# Patient Record
Sex: Male | Born: 1960 | Marital: Single | Smoking: Never smoker
Health system: Southern US, Community
[De-identification: ages and names within clinical notes are randomized; demographics above are authoritative.]

## PROBLEM LIST (undated history)

## (undated) HISTORY — PX: APPENDECTOMY: SHX54

---

## 2004-03-23 ENCOUNTER — Ambulatory Visit: Payer: Self-pay | Admitting: Internal Medicine

## 2004-06-15 ENCOUNTER — Ambulatory Visit: Payer: Self-pay | Admitting: Gastroenterology

## 2005-12-27 IMAGING — US ABDOMEN ULTRASOUND
1 series · 17 of 25 positions shown · non-contrast
Comparison: none

REASON FOR EXAM: Right flank and back pain, unknown etiology
COMMENTS:

[Series 1: abdomen ultrasound · 17 of 87 slices shown]
[im 1/87]
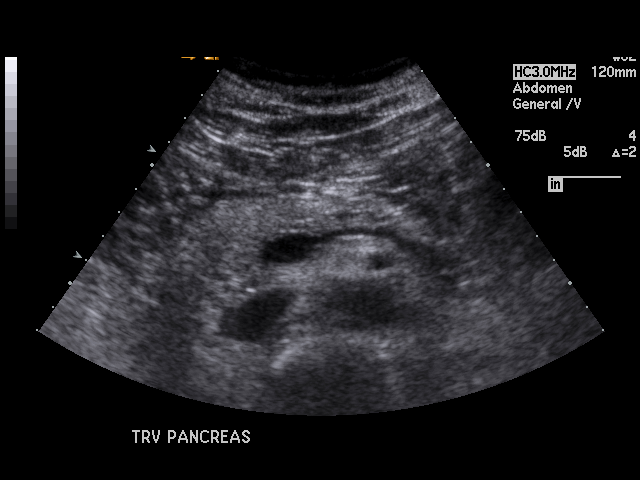
[im 8/87]
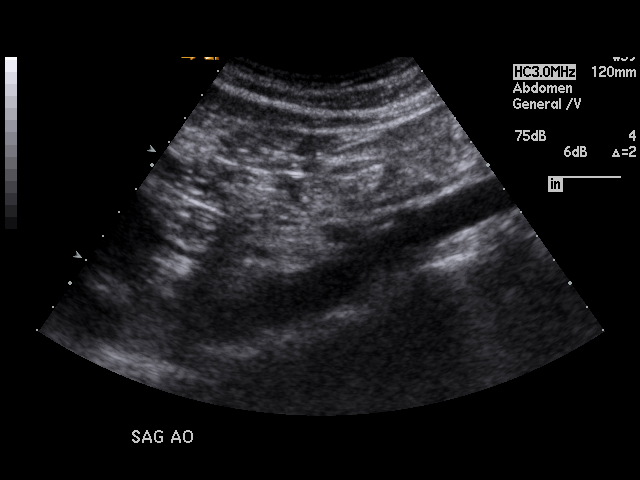
[im 11/87]
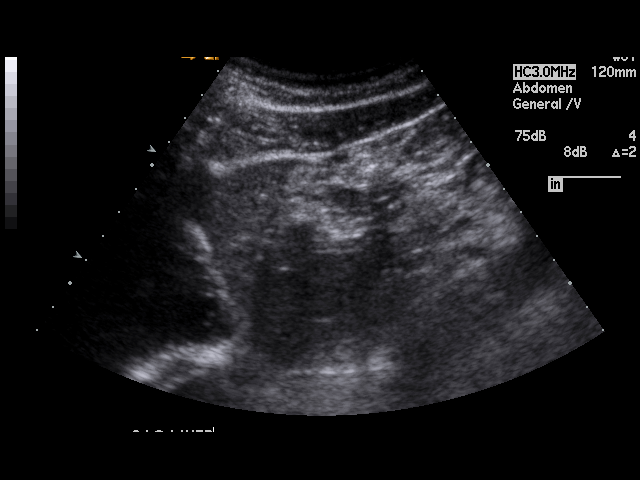
[im 18/87]
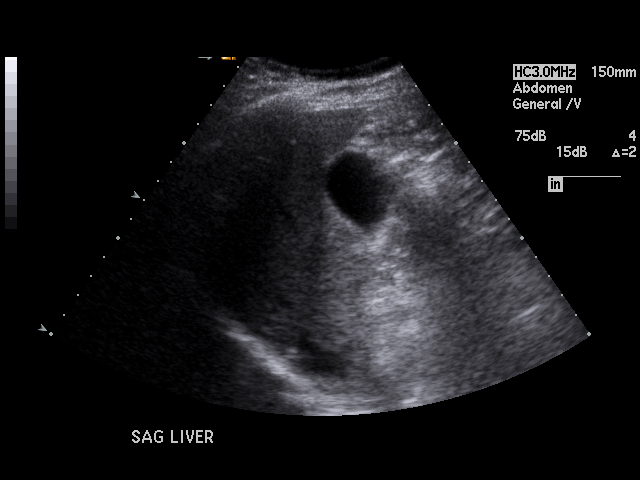
[im 22/87]
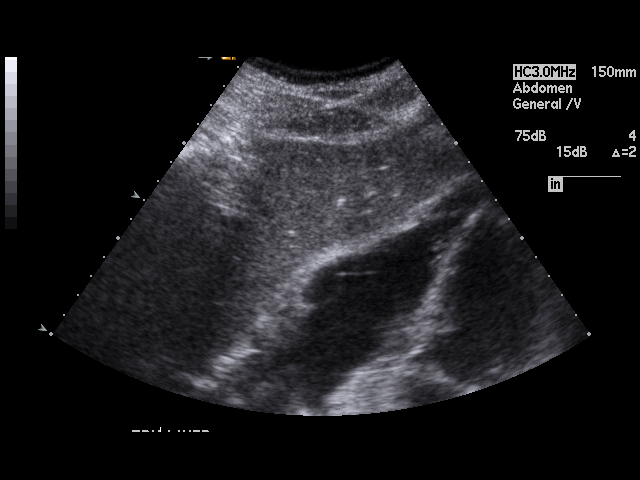
[im 29/87]
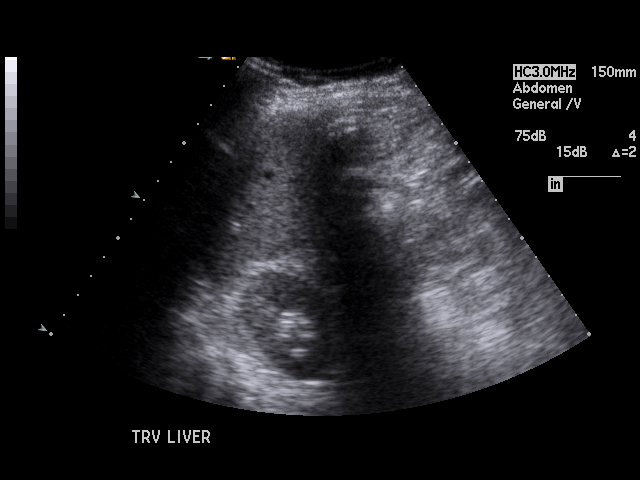
[im 33/87]
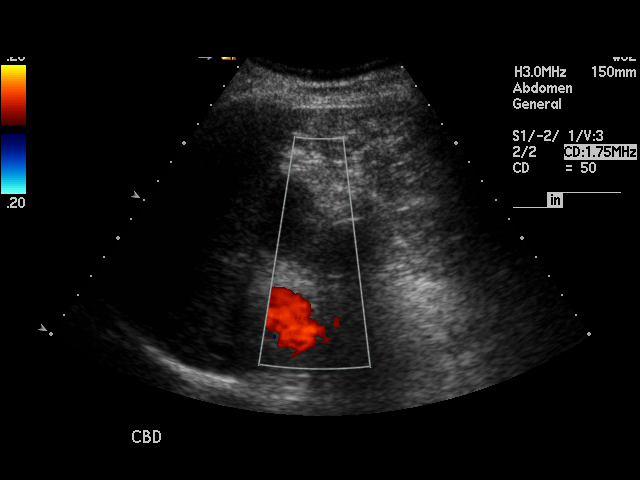
[im 40/87]
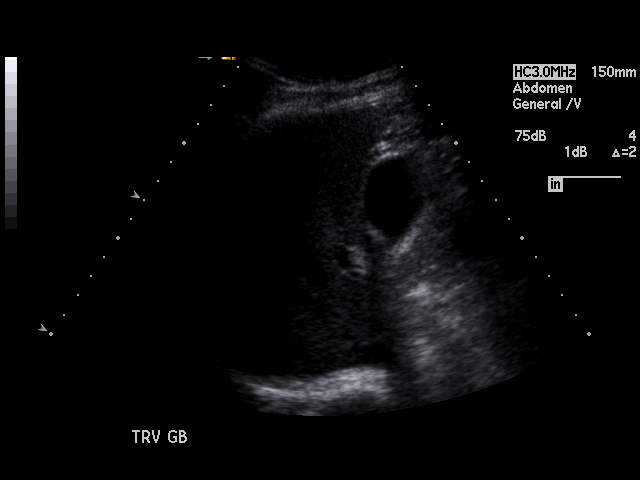
[im 44/87]
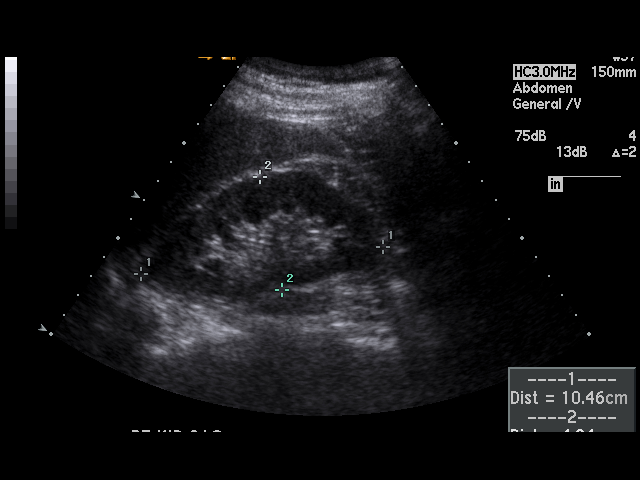
[im 47/87]
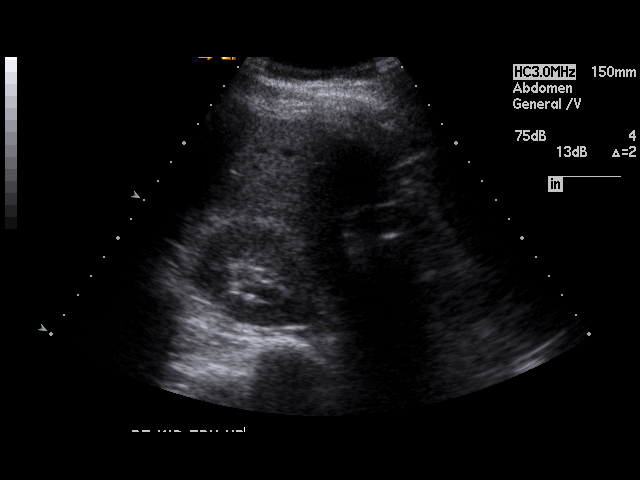
[im 54/87]
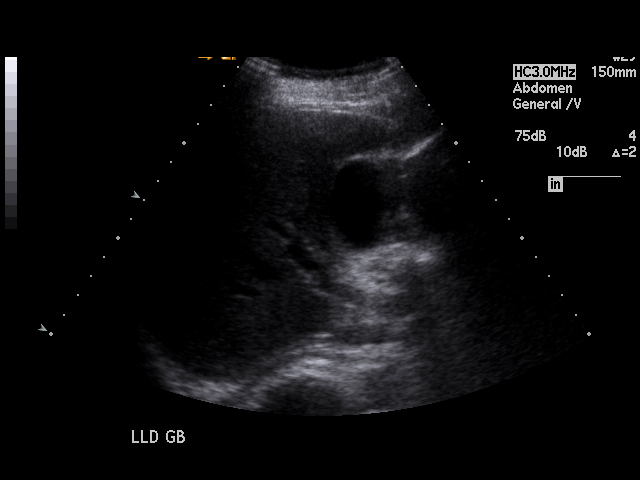
[im 58/87]
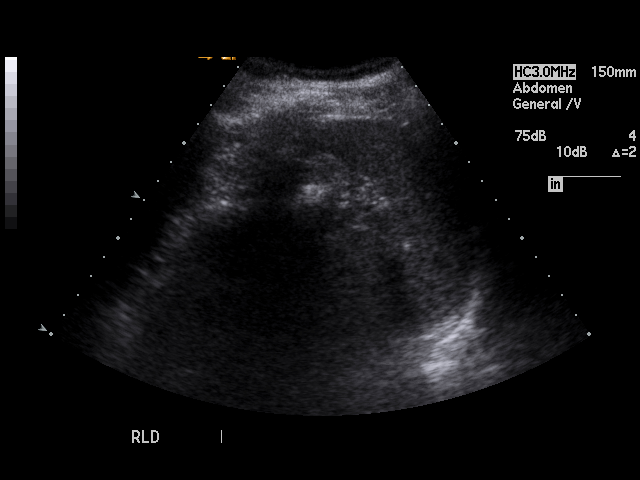
[im 65/87]
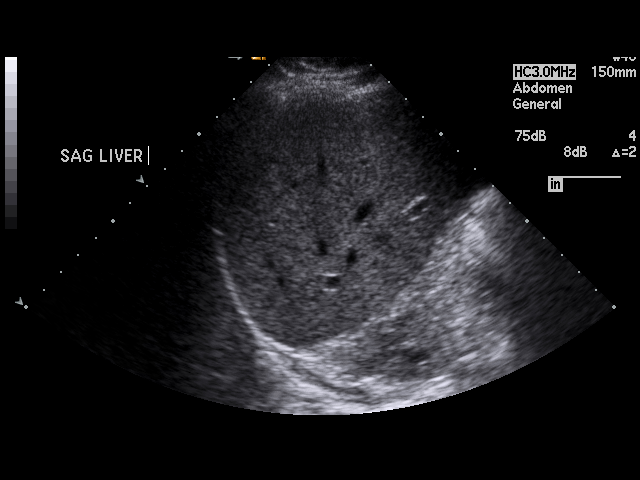
[im 69/87]
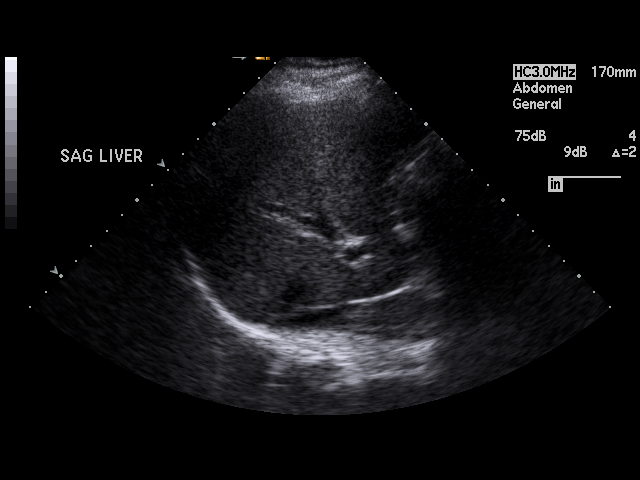
[im 76/87]
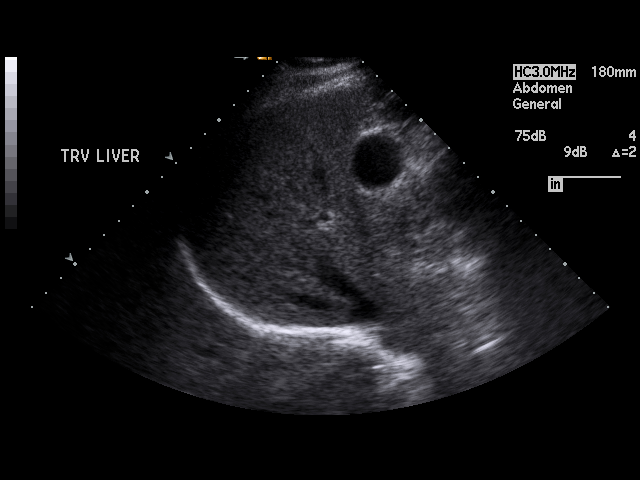
[im 79/87]
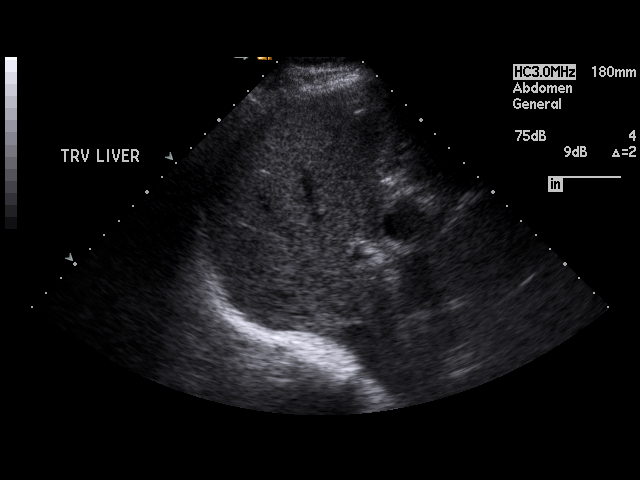
[im 87/87]
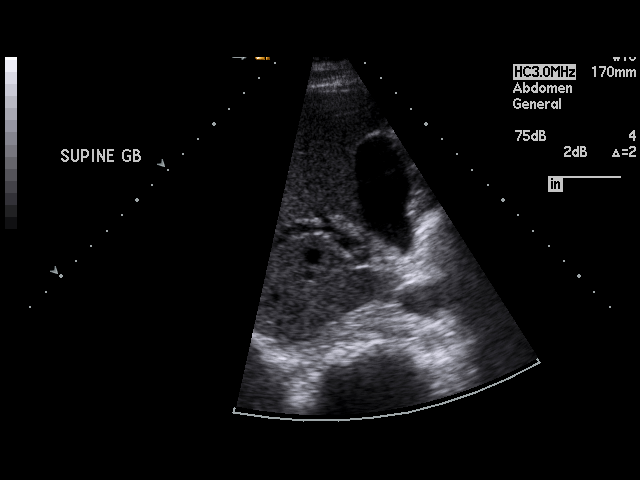

[17 of 25 positions shown; findings below may reference images not displayed]

PROCEDURE:     US  - US ABDOMEN GENERAL SURVEY  - March 23, 2004  [DATE]

RESULT:     The liver, spleen and pancreas are normal in appearance. The
visualized portion of the abdominal aorta reveals no significant
abnormalities. No gallstones are seen. There is no thickening of the
gallbladder wall. The common bile duct measures 3.5 mm in diameter which is
within normal limits. The kidneys show no hydronephrosis. There is no
ascites.
IMPRESSION: No significant abnormalities are noted.

## 2010-10-13 LAB — PSA

## 2014-08-12 DIAGNOSIS — J309 Allergic rhinitis, unspecified: Secondary | ICD-10-CM

## 2014-08-16 ENCOUNTER — Encounter: Payer: Self-pay | Admitting: Unknown Physician Specialty

## 2014-08-16 ENCOUNTER — Ambulatory Visit (INDEPENDENT_AMBULATORY_CARE_PROVIDER_SITE_OTHER): Payer: BLUE CROSS/BLUE SHIELD | Admitting: Unknown Physician Specialty

## 2014-08-16 VITALS — BP 127/76 | HR 73 | Temp 98.5°F | Ht 65.2 in | Wt 175.2 lb

## 2014-08-16 DIAGNOSIS — Z0000 Encounter for general adult medical examination without abnormal findings: Secondary | ICD-10-CM | POA: Diagnosis not present

## 2014-08-16 NOTE — Patient Instructions (Signed)
Think you're too busy to work out? We have the workout for you. In minutes, high-intensity interval training (H.I.I.T.) will have you sweating, breathing hard and maximizing the health benefits of exercise without the time commitment. Best of all, it's scientifically proven to work.  What Is H.I.I.T.? SHORT WORKOUTS 101 High-intensity interval training - referred to as H.I.I.T. - is based on the idea that short bursts of strenuous exercise can have a big impact on the body. If moderate exercise - like a 20-minute jog - is good for your heart, lungs and metabolism, H.I.I.T. packs the benefits of that workout and more into a few minutes. It may sound too good to be true, but learning this exercise technique and adapting it to your life can mean saving hours at the gym. If you think you don't have time to exercise, H.I.I.T. may be the workout for you.  You can try it with any aerobic activity you like. The principles of H.I.I.T. can be applied to running, biking, stair climbing, swimming, jumping rope, rowing, even hopping or skipping. (Yes, skipping!)  The downside? Even though H.I.I.T. lasts only minutes, the workouts are tough, requiring you to push your body near its limit.  HOW INTENSE IS HIGH INTENSITY? High-intensity exercise is obviously not a casual stroll down the street, but it's not a run-till-your-lungs-pop explosion, either. Think breathless, not winded. Heart-pounding, not exploding. Legs pumping, but not uncontrolled.  You don't need any fancy heart rate monitors to do these workouts. Use cues from your body as a guide. In the middle of a high-intensity workout you should be able to say single words, but not complete whole sentences. So, if you can keep chatting to your workout partner during this workout, pump it up a few notches.  10-27-28 Training This simple program will help you make the most of a short workout by improving heart health and endurance. Try it with your favorite  cardiovascular activity. The essentials of 10-27-28 training are simple. Run, ride or perhaps row on a rowing machine gently for 30 seconds, accelerate to a moderate pace for 20 seconds, then sprint as hard as you can for 10 seconds. (It should be called 30-20-10 training, obviously, but that is not as catchy.) Repeat.  You don't even need a stopwatch to monitor the 30-, 20-, and 10-second time changes. You can just count to yourself, which seems to make the intervals pass more quickly.  Best of all? The grueling, all-out portion of the workout lasts for only 10 seconds. C'mon, you can do anything for 10 seconds, right?  Got 10 Minutes? A solitary minute of hard work buried in 10 minutes of activity can make a big difference.  The 10-Minute Workout If you like to run, bike, row or swim - just a little bit - this workout is a great option for you. Step 1 Warm up for 2 minutes Step 2 Pedal, run or swim all-out for 20 seconds. Repeat 2 more times Warm down for 3 minutes    GET STARTED To benefit the most from really, really short workouts, you need to build the habit of doing them into your hectic life. Ideally, you'll complete the workout three times a week. The best way to build that habit is to start small and be willing to tweak your schedule where you can to accommodate your new workout.  First set up a spot in your house for your workout, equipped with whatever you need to get the job done: sneakers, a   chair, a towel, etc. Then slot your workout in before you would normally shower. (You can even do it in the bathroom.) Or wake up five minutes earlier and do it first thing in the morning, so you can head off to work feeling accomplished. Or do it during your lunch hour. Run up your office's stairs or grab a private conference room for just a few minutes. Or work it into your commute. If you walk or bike to work, add some heavy intervals on the way home.  GET A BOOST FROM MUSIC Creating a  workout playlist of high-energy tunes you love will not make your workout feel easier, but it may cause you to exercise harder without even realizing it. Best of all, if you are doing a really short workout, you need only one or two great tunes to get you through. If you are willing to try something a bit different, make your own music as you exercise. Sing, hum, clap your hands, whatever you can do to jam along to your playlist. It may give you an extra boost to finish strong.  Find a song or podcast that's the length of your really, really short workout. By the time the song is over, you're done.  Excerpted from the [employer] Well column http://www.nytimes.com/well/guides/really-really-short-workouts?smid=fb-nytwell&smtyp=pay  

## 2014-08-16 NOTE — Progress Notes (Signed)
   BP 127/76 mmHg  Pulse 73  Temp(Src) 98.5 F (36.9 C)  Ht 5' 5.2" (1.656 m)  Wt 175 lb 3.2 oz (79.47 kg)  BMI 28.98 kg/m2  SpO2 98%   Subjective:    Patient ID: Jon Payne, male    DOB: 1960-07-10, 54 y.o.   MRN: [id number]  HPI: Jon Payne is a 54 y.o. male  Chief Complaint  Patient presents with  . Annual Exam    Relevant past medical, surgical, family and social history reviewed and updated as indicated. Interim medical history since our last visit reviewed. Allergies and medications reviewed and updated.  Review of Systems  Constitutional: Negative.   HENT: Negative.   Eyes: Negative.   Respiratory: Negative.   Cardiovascular: Negative.   Gastrointestinal: Negative.   Endocrine: Negative.   Genitourinary: Negative.   Skin: Negative.   Allergic/Immunologic: Negative.   Neurological: Negative.   Hematological: Negative.   Psychiatric/Behavioral: Negative.     Per HPI unless specifically indicated above     Objective:    BP 127/76 mmHg  Pulse 73  Temp(Src) 98.5 F (36.9 C)  Ht 5' 5.2" (1.656 m)  Wt 175 lb 3.2 oz (79.47 kg)  BMI 28.98 kg/m2  SpO2 98%  Wt Readings from Last 3 Encounters:  08/16/14 175 lb 3.2 oz (79.47 kg)  01/28/12 185 lb (83.915 kg)    Physical Exam  Constitutional: He is oriented to person, place, and time. He appears well-developed and well-nourished.  HENT:  Head: Normocephalic.  Eyes: Pupils are equal, round, and reactive to light.  Cardiovascular: Normal rate, regular rhythm and normal heart sounds.   Pulmonary/Chest: Effort normal.  Abdominal: Soft. Bowel sounds are normal.  Musculoskeletal: Normal range of motion.  Neurological: He is alert and oriented to person, place, and time. He has normal reflexes.  Skin: Skin is warm and dry.  Psychiatric: He has a normal mood and affect. His behavior is normal. Judgment and thought content normal.  Nursing note and vitals reviewed.   Results for orders placed or  performed in visit on 08/12/14  PSA  Result Value Ref Range   PSA from PP       Assessment & Plan:   Problem List Items Addressed This Visit    None    Visit Diagnoses    Annual physical exam    -  Primary    Relevant Orders    CBC with Differential/Platelet    Comprehensive metabolic panel    Hepatitis C antibody    HIV antibody    Lipid Panel w/o Chol/HDL Ratio    PSA    TSH       Handout on HITT training given  Follow up plan: Return in about 1 year (around 08/16/2015).

## 2014-08-17 ENCOUNTER — Encounter: Payer: Self-pay | Admitting: Unknown Physician Specialty

## 2014-08-17 LAB — HEPATITIS C ANTIBODY

## 2014-08-17 LAB — CBC WITH DIFFERENTIAL/PLATELET
BASOS: 0 %
Basophils Absolute: 0 10*3/uL (ref 0.0–0.2)
EOS (ABSOLUTE): 0.1 10*3/uL (ref 0.0–0.4)
EOS: 1 %
HEMATOCRIT: 45.7 % (ref 37.5–51.0)
Hemoglobin: 15.4 g/dL (ref 12.6–17.7)
Immature Grans (Abs): 0 10*3/uL (ref 0.0–0.1)
Immature Granulocytes: 0 %
Lymphocytes Absolute: 2 10*3/uL (ref 0.7–3.1)
Lymphs: 30 %
MCH: 31.4 pg (ref 26.6–33.0)
MCHC: 33.7 g/dL (ref 31.5–35.7)
MCV: 93 fL (ref 79–97)
MONOCYTES: 8 %
Monocytes Absolute: 0.6 10*3/uL (ref 0.1–0.9)
Neutrophils Absolute: 4 10*3/uL (ref 1.4–7.0)
Neutrophils: 61 %
Platelets: 188 10*3/uL (ref 150–379)
RBC: 4.91 x10E6/uL (ref 4.14–5.80)
RDW: 13.2 % (ref 12.3–15.4)
WBC: 6.7 10*3/uL (ref 3.4–10.8)

## 2014-08-17 LAB — COMPREHENSIVE METABOLIC PANEL
ALK PHOS: 80 IU/L (ref 39–117)
ALT: 27 IU/L (ref 0–44)
AST: 23 IU/L (ref 0–40)
Albumin/Globulin Ratio: 1.8 (ref 1.1–2.5)
Albumin: 4.6 g/dL (ref 3.5–5.5)
BUN/Creatinine Ratio: 19 (ref 9–20)
BUN: 24 mg/dL (ref 6–24)
Bilirubin Total: 0.6 mg/dL (ref 0.0–1.2)
CO2: 23 mmol/L (ref 18–29)
Calcium: 9.3 mg/dL (ref 8.7–10.2)
Chloride: 102 mmol/L (ref 97–108)
Creatinine, Ser: 1.27 mg/dL (ref 0.76–1.27)
GFR, EST AFRICAN AMERICAN: 75 mL/min/{1.73_m2} (ref >59)
GFR, EST NON AFRICAN AMERICAN: 65 mL/min/{1.73_m2} (ref >59)
Globulin, Total: 2.5 g/dL (ref 1.5–4.5)
Glucose: 97 mg/dL (ref 65–99)
Potassium: 4.6 mmol/L (ref 3.5–5.2)
Sodium: 144 mmol/L (ref 134–144)
TOTAL PROTEIN: 7.1 g/dL (ref 6.0–8.5)

## 2014-08-17 LAB — LIPID PANEL W/O CHOL/HDL RATIO
Cholesterol, Total: 210 mg/dL — ABNORMAL HIGH (ref 100–199)
HDL: 29 mg/dL — ABNORMAL LOW (ref >39)
LDL CALC: 117 mg/dL — AB (ref 0–99)
TRIGLYCERIDES: 319 mg/dL — AB (ref 0–149)
VLDL CHOLESTEROL CAL: 64 mg/dL — AB (ref 5–40)

## 2014-08-17 LAB — TSH: TSH: 1.37 u[IU]/mL (ref 0.450–4.500)

## 2014-08-17 LAB — HIV ANTIBODY (ROUTINE TESTING W REFLEX): HIV Screen 4th Generation wRfx: NONREACTIVE

## 2014-08-17 LAB — PSA: Prostate Specific Ag, Serum: 0.6 ng/mL (ref 0.0–4.0)

## 2016-08-23 ENCOUNTER — Ambulatory Visit: Admission: EM | Admit: 2016-08-23 | Discharge: 2016-08-23 | Discharge status: Absent without leave | Payer: Self-pay

## 2016-08-24 ENCOUNTER — Encounter: Payer: Self-pay | Admitting: Emergency Medicine

## 2016-08-24 ENCOUNTER — Ambulatory Visit
Admission: EM | Admit: 2016-08-24 | Discharge: 2016-08-24 | Disposition: A | Discharge status: Home / Self Care | Payer: Self-pay | Attending: Family Medicine | Admitting: Family Medicine

## 2016-08-24 DIAGNOSIS — Z0289 Encounter for other administrative examinations: Secondary | ICD-10-CM

## 2016-08-24 LAB — DEPT OF TRANSP DIPSTICK, URINE (ARMC ONLY)
Glucose, UA: NEGATIVE mg/dL
Hgb urine dipstick: NEGATIVE
Protein, ur: NEGATIVE mg/dL
Specific Gravity, Urine: 1.025 (ref 1.005–1.030)

## 2016-08-24 NOTE — ED Provider Notes (Signed)
[facility]    CSN: [id number] Arrival date & time: 08/24/16  0849     History   Chief Complaint Chief Complaint  Patient presents with  . DOT Physical    HPI Jon Payne is a 56 y.o. male.   HPI  This a 56 year old male who presents for a DOT physical. 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 He denies any medical problems. Is not taking any prescriptive medications. He has a history of an appendectomy and also repair of a fractured nose.        History reviewed. No pertinent past medical history.  Patient Active Problem List   Diagnosis Date Noted  . Allergic rhinitis 08/12/2014    Past Surgical History:  Procedure Laterality Date  . APPENDECTOMY         Home Medications    Prior to Admission medications   Not on File    Family History Family History  Problem Relation Age of Onset  . Thyroid disease Mother   . Diabetes Maternal Grandmother     Social History Social History  Substance Use Topics  . Smoking status: Never Smoker  . Smokeless tobacco: Never Used  . Alcohol use No     Allergies   Patient has no known allergies.   Review of Systems Review of Systems  All other systems reviewed and are negative.    Physical Exam Triage Vital Signs ED Triage Vitals  Enc Vitals Group     BP 08/24/16 0915 128/78     Pulse Rate 08/24/16 0915 61     Resp 08/24/16 0915 16     Temp 08/24/16 0915 98 F (36.7 C)     Temp Source 08/24/16 0915 Oral     SpO2 08/24/16 0915 100 %     Weight 08/24/16 0914 163 lb (73.9 kg)     Height 08/24/16 0914 5\' 5"  (1.651 m)     Head Circumference --      [place] --      Pain Score 08/24/16 0914 0     Pain Loc --      Pain Edu? --      Excl. in GC? --    No data found.   Updated Vital Signs BP 128/78 (BP Location: Right Arm)   Pulse 61   Temp 98 F (36.7 C) (Oral)   Resp 16   Ht 5\' 5"  (1.651 m)   Wt 163 lb (73.9 kg)   SpO2 100%   BMI 27.12 kg/m    Visual Acuity Right Eye Distance: 20/15 corrected Left Eye Distance: 20/20 corrected Bilateral Distance: 20/13 corrected  Right Eye Near:   Left Eye Near:    Bilateral Near:     Physical Exam  Constitutional:  Referred to DOT physical  Nursing note and vitals reviewed.    UC Treatments / Results  Labs (all labs ordered are listed, but only abnormal results are displayed) Labs Reviewed  DEPT OF TRANSP DIPSTICK, URINE([facility] ONLY)    EKG  EKG Interpretation None       Radiology No results found.  Procedures Procedures (including critical care time)  Medications Ordered in UC Medications - No data to display   Initial Impression / Assessment and Plan / UC Course  I have reviewed the triage vital signs and the nursing notes.  Pertinent labs & imaging results that were available during my care of the patient were reviewed by me and  considered in my medical decision making (see chart for details).   patient qualifies for 2 year DOT certificate. He does require glasses when driving.    Final Clinical Impressions(s) / UC Diagnoses   Final diagnoses:  Encounter for examination required by Department of Transportation (DOT)    New Prescriptions There are no discharge medications for this patient.    Controlled Substance Prescriptions [place] Controlled Substance Registry consulted? Not Applicable   Lutricia Feil, PA-C 08/24/16 1610

## 2016-08-24 NOTE — ED Triage Notes (Signed)
Patient here for DOT Physical.
